# Patient Record
Sex: Male | Born: 1990 | Race: Black or African American | Hispanic: No | Marital: Married | Smoking: Never smoker
Health system: Southern US, Community
[De-identification: ages and names within clinical notes are randomized; demographics above are authoritative.]

## PROBLEM LIST (undated history)

## (undated) DIAGNOSIS — J45909 Unspecified asthma, uncomplicated: Secondary | ICD-10-CM

## (undated) DIAGNOSIS — T7840XA Allergy, unspecified, initial encounter: Secondary | ICD-10-CM

## (undated) HISTORY — DX: Unspecified asthma, uncomplicated: J45.909

## (undated) HISTORY — PX: NO PAST SURGERIES: SHX2092

## (undated) HISTORY — DX: Allergy, unspecified, initial encounter: T78.40XA

---
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
## 2016-02-29 ENCOUNTER — Ambulatory Visit (INDEPENDENT_AMBULATORY_CARE_PROVIDER_SITE_OTHER): Payer: BLUE CROSS/BLUE SHIELD | Admitting: Family Medicine

## 2016-02-29 ENCOUNTER — Encounter: Payer: Self-pay | Admitting: Family Medicine

## 2016-02-29 VITALS — BP 95/57 | HR 81 | Temp 97.8°F | Resp 12 | Ht 70.25 in | Wt 183.2 lb

## 2016-02-29 DIAGNOSIS — Z0000 Encounter for general adult medical examination without abnormal findings: Secondary | ICD-10-CM | POA: Insufficient documentation

## 2016-02-29 DIAGNOSIS — Z23 Encounter for immunization: Secondary | ICD-10-CM | POA: Diagnosis not present

## 2016-02-29 HISTORY — DX: Encounter for general adult medical examination without abnormal findings: Z00.00

## 2016-02-29 NOTE — Progress Notes (Signed)
Pre visit review using our clinic review tool, if applicable. No additional management support is needed unless otherwise documented below in the visit note. 

## 2016-02-29 NOTE — Progress Notes (Signed)
Subjective:  Patient ID: Darren Moore, male    DOB: 1991/02/25  Age: 26 y.o. MRN: [id number]  CC: Establish care/physical  HPI Darren ChurnGarland B Nakagawa is a 26 y.o. male presents to the clinic today to establish care.   Preventative Healthcare  Immunizations  Tetanus - ~ 2009.  Pneumococcal - N/A.  Flu - Flu vaccine today.  Labs: Declines labs at this time.  Exercise: No regular exercise.   Alcohol use: Yes. See below.  Smoking/tobacco use: No.  Regular dental exams: Yes.   Wears seat belt: Yes.   PMH, Surgical Hx, Family Hx, Social History reviewed and updated as below.  Past Medical History:  Diagnosis Date  . Asthma    Past Surgical History:  Procedure Laterality Date  . NO PAST SURGERIES     Family History  Problem Relation Age of Onset  . Hyperlipidemia Mother   . Hypertension Mother   . Diabetes Mother   . Hyperlipidemia Father   . Hypertension Father   . Diabetes Father   . Lung cancer Paternal Uncle   . Stroke Maternal Grandmother   . Prostate cancer Maternal Grandfather   . Prostate cancer Paternal Grandfather   . Breast cancer Cousin    Social History  Substance Use Topics  . Smoking status: Never Smoker  . Smokeless tobacco: Never Used  . Alcohol use 0.6 - 1.2 oz/week    1 - 2 Standard drinks or equivalent per week   Review of Systems General: Denies unexplained weight loss, fever. Skin: Denies new or changing mole, sore/wound that won't heal. ENT: Trouble hearing, ringing in the ears, sores in the mouth, hoarseness, trouble swallowing. Eyes: Denies trouble seeing/visual disturbance. Heart/CV: Denies chest pain, shortness of breath, edema, palpitations. Lungs/Resp: Denies cough, shortness of breath, hemoptysis. Abd/GI: Denies nausea, vomiting, diarrhea, constipation, abdominal pain, hematochezia, melena. GU: Denies dysuria, incontinence, hematuria, urinary frequency, difficulty starting/keeping stream, penile discharge, sexual  difficulty, lump in testicles. MSK: Denies joint pain/swelling, myalgias. Neuro: Denies headaches, weakness, numbness, dizziness, syncope. Psych: Denies sadness, anxiety, stress, memory difficulty. Endocrine: Denies polyuria and polydipsia.  Objective:   Today's Vitals: BP (!) 95/57   Pulse 81   Temp 97.8 F (36.6 C) (Oral)   Resp 12   Ht 5' 10.25" (1.784 m) Comment: with shoes  Wt 183 lb 4 oz (83.1 kg)   SpO2 100%   BMI 26.11 kg/m   Physical Exam  Constitutional: He is oriented to person, place, and time. He appears well-developed and well-nourished. No distress.  HENT:  Head: Normocephalic and atraumatic.  Nose: Nose normal.  Mouth/Throat: Oropharynx is clear and moist. No oropharyngeal exudate.  Normal TM's bilaterally.   Eyes: Conjunctivae are normal. No scleral icterus.  Neck: Neck supple.  Cardiovascular: Normal rate and regular rhythm.   No murmur heard. Pulmonary/Chest: Effort normal and breath sounds normal. He has no wheezes. He has no rales.  Abdominal: Soft. He exhibits no distension. There is no tenderness. There is no rebound and no guarding.  Musculoskeletal: Normal range of motion. He exhibits no edema.  Lymphadenopathy:    He has no cervical adenopathy.  Neurological: He is alert and oriented to person, place, and time.  Skin: Skin is warm and dry. No rash noted.  Psychiatric: He has a normal mood and affect.  Vitals reviewed.  Assessment & Plan:   Problem List Items Addressed This Visit    Annual physical exam    Flu vaccine today. Tdap up to date. Declines screening labs  at this time. No medical problems/concerns. Follow up annually.       Other Visit Diagnoses    Encounter for immunization       Relevant Orders   Flu Vaccine QUAD 36+ mos IM (Completed)     Follow-up: Annually  Everlene Other DO [facility]

## 2016-02-29 NOTE — Assessment & Plan Note (Addendum)
Flu vaccine today. Tdap up to date. Declines screening labs at this time. No medical problems/concerns. Follow up annually.

## 2016-02-29 NOTE — Patient Instructions (Signed)
Follow up annually.  Take care  Dr. Keian Odriscoll   Health Maintenance, Male A healthy lifestyle and preventative care can promote health and wellness.  Maintain regular health, dental, and eye exams.  Eat a healthy diet. Foods like vegetables, fruits, whole grains, low-fat dairy products, and lean protein foods contain the nutrients you need and are low in calories. Decrease your intake of foods high in solid fats, added sugars, and salt. Get information about a proper diet from your health care provider, if necessary.  Regular physical exercise is one of the most important things you can do for your health. Most adults should get at least 150 minutes of moderate-intensity exercise (any activity that increases your heart rate and causes you to sweat) each week. In addition, most adults need muscle-strengthening exercises on 2 or more days a week.   Maintain a healthy weight. The body mass index (BMI) is a screening tool to identify possible weight problems. It provides an estimate of body fat based on height and weight. Your health care provider can find your BMI and can help you achieve or maintain a healthy weight. For males 20 years and older:  A BMI below 18.5 is considered underweight.  A BMI of 18.5 to 24.9 is normal.  A BMI of 25 to 29.9 is considered overweight.  A BMI of 30 and above is considered obese.  Maintain normal blood lipids and cholesterol by exercising and minimizing your intake of saturated fat. Eat a balanced diet with plenty of fruits and vegetables. Blood tests for lipids and cholesterol should begin at age 20 and be repeated every 5 years. If your lipid or cholesterol levels are high, you are over age 50, or you are at high risk for heart disease, you may need your cholesterol levels checked more frequently.Ongoing high lipid and cholesterol levels should be treated with medicines if diet and exercise are not working.  If you smoke, find out from your health care  provider how to quit. If you do not use tobacco, do not start.  Lung cancer screening is recommended for adults aged 55-80 years who are at high risk for developing lung cancer because of a history of smoking. A yearly low-dose CT scan of the lungs is recommended for people who have at least a 30-pack-year history of smoking and are current smokers or have quit within the past 15 years. A pack year of smoking is smoking an average of 1 pack of cigarettes a day for 1 year (for example, a 30-pack-year history of smoking could mean smoking 1 pack a day for 30 years or 2 packs a day for 15 years). Yearly screening should continue until the smoker has stopped smoking for at least 15 years. Yearly screening should be stopped for people who develop a health problem that would prevent them from having lung cancer treatment.  If you choose to drink alcohol, do not have more than 2 drinks per day. One drink is considered to be 12 oz (360 mL) of beer, 5 oz (150 mL) of wine, or 1.5 oz (45 mL) of liquor.  Avoid the use of street drugs. Do not share needles with anyone. Ask for help if you need support or instructions about stopping the use of drugs.  High blood pressure causes heart disease and increases the risk of stroke. High blood pressure is more likely to develop in:  People who have blood pressure in the end of the normal range (100-139/85-89 mm Hg).  People who   are overweight or obese.  People who are African American.  If you are 18-39 years of age, have your blood pressure checked every 3-5 years. If you are 40 years of age or older, have your blood pressure checked every year. You should have your blood pressure measured twice-once when you are at a hospital or clinic, and once when you are not at a hospital or clinic. Record the average of the two measurements. To check your blood pressure when you are not at a hospital or clinic, you can use:  An automated blood pressure machine at a pharmacy.  A  home blood pressure monitor.  If you are 45-79 years old, ask your health care provider if you should take aspirin to prevent heart disease.  Diabetes screening involves taking a blood sample to check your fasting blood sugar level. This should be done once every 3 years after age 45 if you are at a normal weight and without risk factors for diabetes. Testing should be considered at a younger age or be carried out more frequently if you are overweight and have at least 1 risk factor for diabetes.  Colorectal cancer can be detected and often prevented. Most routine colorectal cancer screening begins at the age of 50 and continues through age 75. However, your health care provider may recommend screening at an earlier age if you have risk factors for colon cancer. On a yearly basis, your health care provider may provide home test kits to check for hidden blood in the stool. A small camera at the end of a tube may be used to directly examine the colon (sigmoidoscopy or colonoscopy) to detect the earliest forms of colorectal cancer. Talk to your health care provider about this at age 50 when routine screening begins. A direct exam of the colon should be repeated every 5-10 years through age 75, unless early forms of precancerous polyps or small growths are found.  People who are at an increased risk for hepatitis B should be screened for this virus. You are considered at high risk for hepatitis B if:  You were born in a country where hepatitis B occurs often. Talk with your health care provider about which countries are considered high risk.  Your parents were born in a high-risk country and you have not received a shot to protect against hepatitis B (hepatitis B vaccine).  You have HIV or AIDS.  You use needles to inject street drugs.  You live with, or have sex with, someone who has hepatitis B.  You are a man who has sex with other men (MSM).  You get hemodialysis treatment.  You take certain  medicines for conditions like cancer, organ transplantation, and autoimmune conditions.  Hepatitis C blood testing is recommended for all people born from 1945 through 1965 and any individual with known risk factors for hepatitis C.  Healthy men should no longer receive prostate-specific antigen (PSA) blood tests as part of routine cancer screening. Talk to your health care provider about prostate cancer screening.  Testicular cancer screening is not recommended for adolescents or adult males who have no symptoms. Screening includes self-exam, a health care provider exam, and other screening tests. Consult with your health care provider about any symptoms you have or any concerns you have about testicular cancer.  Practice safe sex. Use condoms and avoid high-risk sexual practices to reduce the spread of sexually transmitted infections (STIs).  You should be screened for STIs, including gonorrhea and chlamydia if:  You   are sexually active and are younger than 24 years.  You are older than 24 years, and your health care provider tells you that you are at risk for this type of infection.  Your sexual activity has changed since you were last screened, and you are at an increased risk for chlamydia or gonorrhea. Ask your health care provider if you are at risk.  If you are at risk of being infected with HIV, it is recommended that you take a prescription medicine daily to prevent HIV infection. This is called pre-exposure prophylaxis (PrEP). You are considered at risk if:  You are a man who has sex with other men (MSM).  You are a heterosexual man who is sexually active with multiple partners.  You take drugs by injection.  You are sexually active with a partner who has HIV.  Talk with your health care provider about whether you are at high risk of being infected with HIV. If you choose to begin PrEP, you should first be tested for HIV. You should then be tested every 3 months for as long as  you are taking PrEP.  Use sunscreen. Apply sunscreen liberally and repeatedly throughout the day. You should seek shade when your shadow is shorter than you. Protect yourself by wearing long sleeves, pants, a wide-brimmed hat, and sunglasses year round whenever you are outdoors.  Tell your health care provider of new moles or changes in moles, especially if there is a change in shape or color. Also, tell your health care provider if a mole is larger than the size of a pencil eraser.  A one-time screening for abdominal aortic aneurysm (AAA) and surgical repair of large AAAs by ultrasound is recommended for men aged 65-75 years who are current or former smokers.  Stay current with your vaccines (immunizations). This information is not intended to replace advice given to you by your health care provider. Make sure you discuss any questions you have with your health care provider. Document Released: 08/11/2007 Document Revised: 03/05/2014 Document Reviewed: 11/16/2014 Elsevier Interactive Patient Education  2017 [employer]  

## 2016-10-03 ENCOUNTER — Encounter: Payer: Self-pay | Admitting: Family Medicine

## 2017-03-01 ENCOUNTER — Encounter: Payer: BLUE CROSS/BLUE SHIELD | Admitting: Family Medicine

## 2021-05-12 ENCOUNTER — Encounter: Payer: Self-pay | Admitting: Family

## 2021-05-12 ENCOUNTER — Ambulatory Visit (INDEPENDENT_AMBULATORY_CARE_PROVIDER_SITE_OTHER): Payer: No Typology Code available for payment source | Admitting: Family

## 2021-05-12 VITALS — BP 134/85 | HR 80 | Temp 98.7°F | Ht 69.0 in | Wt 220.4 lb

## 2021-05-12 DIAGNOSIS — Z0000 Encounter for general adult medical examination without abnormal findings: Secondary | ICD-10-CM | POA: Diagnosis not present

## 2021-05-12 NOTE — Progress Notes (Signed)
?Phone: [phone] ?  ?Subjective:  ?Patient 31 y.o. male presenting for annual physical. ? ?Chief Complaint  ?Patient presents with  ? Establish Care  ? Annual Exam  ?  Not fasting W/ Labs  ? ? ?See problem oriented charting- ?ROS- full  review of systems was completed and negative. ? ?The following were reviewed and entered/updated in epic: ?Past Medical History:  ?Diagnosis Date  ? Allergy   ? Asthma   ? ?Patient Active Problem List  ? Diagnosis Date Noted  ? Annual physical exam 02/29/2016  ? ?Past Surgical History:  ?Procedure Laterality Date  ? NO PAST SURGERIES    ? ? ?Family History  ?Problem Relation Age of Onset  ? Hyperlipidemia Mother   ? Hypertension Mother   ? Diabetes Mother   ? Hyperlipidemia Father   ? Hypertension Father   ? Diabetes Father   ? Hyperlipidemia Sister   ? Lung cancer Paternal Uncle   ? Stroke Maternal Grandmother   ? Prostate cancer Maternal Grandfather   ? Prostate cancer Paternal Grandfather   ? Breast cancer Cousin   ? ? ?Medications- reviewed and updated ?No current outpatient medications on file.  ? ?No current facility-administered medications for this visit.  ? ? ?Allergies-reviewed and updated ?No Known Allergies ? ?Social History  ? ?Social History Narrative  ? Not on file  ? ?Objective  ?Objective:  ?BP 134/85 (BP Location: Left Arm, Patient Position: Sitting, Cuff Size: Large)   Pulse 80   Temp 98.7 ?F (37.1 ?C) (Temporal)   Ht 5\' 9"  (1.753 m)   Wt 220 lb 6.4 oz (100 kg)   SpO2 99%   BMI 32.55 kg/m?  ?Physical Exam ?Vitals and nursing note reviewed.  ?Constitutional:   ?   General: He is not in acute distress. ?   Appearance: Normal appearance.  ?HENT:  ?   Head: Normocephalic.  ?   Right Ear: Tympanic membrane and external ear normal.  ?   Left Ear: Tympanic membrane and external ear normal.  ?   Nose: Nose normal.  ?   Mouth/Throat:  ?   Mouth: Mucous membranes are moist.  ?Eyes:  ?   Extraocular Movements: Extraocular movements intact.  ?Cardiovascular:  ?    Rate and Rhythm: Normal rate and regular rhythm.  ?Pulmonary:  ?   Effort: Pulmonary effort is normal.  ?   Breath sounds: Normal breath sounds.  ?Abdominal:  ?   General: Abdomen is flat. There is no distension.  ?   Palpations: Abdomen is soft.  ?   Tenderness: There is no abdominal tenderness.  ?Musculoskeletal:     ?   General: Normal range of motion.  ?   Cervical back: Normal range of motion.  ?Skin: ?   General: Skin is warm and dry.  ?Neurological:  ?   Mental Status: He is alert and oriented to person, place, and time.  ?Psychiatric:     ?   Mood and Affect: Mood normal.     ?   Behavior: Behavior normal.     ?   Judgment: Judgment normal.  ? ?  ?Assessment and Plan  ? ?Health Maintenance counseling: ?1. Anticipatory guidance: Patient counseled regarding regular dental exams q6 months, eye exams yearly, avoiding smoking and second hand smoke, limiting alcohol to 2 beverages per day.   ?2. Risk factor reduction:  Advised patient of need for regular exercise and diet rich in fruits and vegetables to reduce risk of heart attack and  stroke.    ?Wt Readings from Last 3 Encounters:  ?05/12/21 220 lb 6.4 oz (100 kg)  ?02/29/16 183 lb 4 oz (83.1 kg)  ? ?3. Immunizations/screenings/ancillary studies ?Immunization History  ?Administered Date(s) Administered  ? Influenza,inj,Quad PF,6+ Mos 02/29/2016  ? Influenza-Unspecified 01/20/2021  ? PPD Test 03/16/2019  ? ?Health Maintenance Due  ?Topic Date Due  ? COVID-19 Vaccine (1) Never done  ? HIV Screening  Never done  ? Hepatitis C Screening  Never done  ? TETANUS/TDAP  02/26/2017  ?  ?4. Skin cancer screening- advised regular sunscreen use. Denies worrisome, changing, or new skin lesions.  ?5. Smoking associated screening:  smokes rarely, socially ?6. STD screening - 1-2/month ? ?Problem List Items Addressed This Visit   ? ?  ? Other  ? Annual physical exam - Primary  ? Relevant Orders  ? Comprehensive metabolic panel  ? TSH  ? Lipid panel  ? CBC with  Differential/Platelet  ? HIV antibody (with reflex)  ? ? ?Recommended follow up: Return for any future concerns . ?No future appointments. ? ? ?Lab/Order associations:non- fasting ? ? ?Jeanie Sewer, NP ? ? ?

## 2021-05-12 NOTE — Patient Instructions (Signed)
Welcome to [facility]! It was a pleasure meeting you today. ? ?Go to the lab for blood work today. ? ?Have a great weekend! ? ? ? ?PLEASE NOTE: ? ?If you had any LAB tests please let us know if you have not heard back within a few days. You may see your results on MyChart before we have a chance to review them but we will give you a call once they are reviewed by [place]. If we ordered any REFERRALS today, please let us know if you have not heard from their office within the next week.  ?Let us know through MyChart if you are needing REFILLS, or have your pharmacy send [place] the request. You can also use MyChart to communicate with me or any office staff. ? ?Please try these tips to maintain a healthy lifestyle: ? ?Eat most of your calories during the day when you are active. Eliminate processed foods including packaged sweets (pies, cakes, cookies), reduce intake of potatoes, white bread, white pasta, and white rice. Look for whole grain options, oat flour or almond flour. ? ?Each meal should contain half fruits/vegetables, one quarter protein, and one quarter carbs (no bigger than a computer mouse). ? ?Cut down on sweet beverages. This includes juice, soda, and sweet tea. Also watch fruit intake, though this is a healthier sweet option, it still contains natural sugar! Limit to 3 servings daily. ? ?Drink at least 1 glass of water with each meal and aim for at least 8 glasses per day ? ?Exercise at least 150 minutes every week.  ? ?

## 2021-05-13 LAB — COMPREHENSIVE METABOLIC PANEL
AG Ratio: 1.7 (calc) (ref 1.0–2.5)
ALT: 26 U/L (ref 9–46)
AST: 31 U/L (ref 10–40)
Albumin: 4.9 g/dL (ref 3.6–5.1)
Alkaline phosphatase (APISO): 67 U/L (ref 36–130)
BUN: 12 mg/dL (ref 7–25)
CO2: 21 mmol/L (ref 20–32)
Calcium: 10.4 mg/dL — ABNORMAL HIGH (ref 8.6–10.3)
Chloride: 106 mmol/L (ref 98–110)
Creat: 1.15 mg/dL (ref 0.60–1.26)
Globulin: 2.9 g/dL (calc) (ref 1.9–3.7)
Glucose, Bld: 100 mg/dL — ABNORMAL HIGH (ref 65–99)
Potassium: 4.7 mmol/L (ref 3.5–5.3)
Sodium: 146 mmol/L (ref 135–146)
Total Bilirubin: 0.5 mg/dL (ref 0.2–1.2)
Total Protein: 7.8 g/dL (ref 6.1–8.1)

## 2021-05-13 LAB — LIPID PANEL
Cholesterol: 175 mg/dL (ref <200)
HDL: 39 mg/dL — ABNORMAL LOW (ref >=40)
LDL Cholesterol (Calc): 120 mg/dL (calc) — ABNORMAL HIGH
Non-HDL Cholesterol (Calc): 136 mg/dL (calc) — ABNORMAL HIGH (ref <130)
Total CHOL/HDL Ratio: 4.5 (calc) (ref <5.0)
Triglycerides: 65 mg/dL (ref <150)

## 2021-05-13 LAB — CBC WITH DIFFERENTIAL/PLATELET
Absolute Monocytes: 684 cells/uL (ref 200–950)
Basophils Absolute: 23 cells/uL (ref 0–200)
Basophils Relative: 0.3 %
Eosinophils Absolute: 53 cells/uL (ref 15–500)
Eosinophils Relative: 0.7 %
HCT: 47.2 % (ref 38.5–50.0)
Hemoglobin: 15.5 g/dL (ref 13.2–17.1)
Lymphs Abs: 2204 cells/uL (ref 850–3900)
MCH: 27.2 pg (ref 27.0–33.0)
MCHC: 32.8 g/dL (ref 32.0–36.0)
MCV: 82.8 fL (ref 80.0–100.0)
MPV: 11.1 fL (ref 7.5–12.5)
Monocytes Relative: 9 %
Neutro Abs: 4636 cells/uL (ref 1500–7800)
Neutrophils Relative %: 61 %
Platelets: 265 10*3/uL (ref 140–400)
RBC: 5.7 10*6/uL (ref 4.20–5.80)
RDW: 12.8 % (ref 11.0–15.0)
Total Lymphocyte: 29 %
WBC: 7.6 10*3/uL (ref 3.8–10.8)

## 2021-05-13 LAB — TSH: TSH: 0.66 mIU/L (ref 0.40–4.50)

## 2021-05-15 LAB — HIV ANTIBODY (ROUTINE TESTING W REFLEX): HIV 1&2 Ab, 4th Generation: NONREACTIVE

## 2021-05-17 NOTE — Progress Notes (Signed)
Glucose, electrolytes, blood count, thyroid, liver & kidney function all normal. ? ?Bad cholesterol # -LDL - is high. ?Need to reduce any fried foods, alcohol, nonnutritional snacks e.g. chips/cookies,pies, cakes and candies, fatty meat (red meat), high fat dairy foods:  including cheese, milk, ice cream.  ?Increase fruits/vegetables/fiber.   ?Continue or restart an exercise routine, shooting for 5-7days per week.  ? ?Recommend repeating fasting lipids (no food or drink except black coffee or water after midnight) in 6 months with an office visit, can schedule today. ? ?

## 2022-02-08 ENCOUNTER — Encounter: Payer: Self-pay | Admitting: *Deleted

## 2022-05-16 ENCOUNTER — Encounter: Payer: Self-pay | Admitting: Family

## 2022-05-16 ENCOUNTER — Ambulatory Visit (INDEPENDENT_AMBULATORY_CARE_PROVIDER_SITE_OTHER): Payer: BC Managed Care – PPO | Admitting: Family

## 2022-05-16 VITALS — BP 124/76 | HR 78 | Temp 97.7°F | Ht 69.0 in | Wt 224.0 lb

## 2022-05-16 DIAGNOSIS — R7989 Other specified abnormal findings of blood chemistry: Secondary | ICD-10-CM | POA: Diagnosis not present

## 2022-05-16 DIAGNOSIS — Z0000 Encounter for general adult medical examination without abnormal findings: Secondary | ICD-10-CM

## 2022-05-16 LAB — LIPID PANEL
Cholesterol: 180 mg/dL (ref 0–200)
HDL: 30.5 mg/dL — ABNORMAL LOW (ref >39.00)
LDL Cholesterol: 139 mg/dL — ABNORMAL HIGH (ref 0–99)
NonHDL: 149.38
Total CHOL/HDL Ratio: 6
Triglycerides: 52 mg/dL (ref 0.0–149.0)
VLDL: 10.4 mg/dL (ref 0.0–40.0)

## 2022-05-16 LAB — COMPREHENSIVE METABOLIC PANEL
ALT: 31 U/L (ref 0–53)
AST: 60 U/L — ABNORMAL HIGH (ref 0–37)
Albumin: 4.3 g/dL (ref 3.5–5.2)
Alkaline Phosphatase: 61 U/L (ref 39–117)
BUN: 11 mg/dL (ref 6–23)
CO2: 28 mEq/L (ref 19–32)
Calcium: 9.4 mg/dL (ref 8.4–10.5)
Chloride: 102 mEq/L (ref 96–112)
Creatinine, Ser: 1.09 mg/dL (ref 0.40–1.50)
GFR: 90.23 mL/min (ref >60.00)
Glucose, Bld: 79 mg/dL (ref 70–99)
Potassium: 4.3 mEq/L (ref 3.5–5.1)
Sodium: 137 mEq/L (ref 135–145)
Total Bilirubin: 0.6 mg/dL (ref 0.2–1.2)
Total Protein: 6.8 g/dL (ref 6.0–8.3)

## 2022-05-16 LAB — CBC WITH DIFFERENTIAL/PLATELET
Basophils Absolute: 0 10*3/uL (ref 0.0–0.1)
Basophils Relative: 0.8 % (ref 0.0–3.0)
Eosinophils Absolute: 0.1 10*3/uL (ref 0.0–0.7)
Eosinophils Relative: 1.3 % (ref 0.0–5.0)
HCT: 46.8 % (ref 39.0–52.0)
Hemoglobin: 15.3 g/dL (ref 13.0–17.0)
Lymphocytes Relative: 39.3 % (ref 12.0–46.0)
Lymphs Abs: 1.6 10*3/uL (ref 0.7–4.0)
MCHC: 32.7 g/dL (ref 30.0–36.0)
MCV: 82.8 fl (ref 78.0–100.0)
Monocytes Absolute: 0.5 10*3/uL (ref 0.1–1.0)
Monocytes Relative: 12.3 % — ABNORMAL HIGH (ref 3.0–12.0)
Neutro Abs: 1.9 10*3/uL (ref 1.4–7.7)
Neutrophils Relative %: 46.3 % (ref 43.0–77.0)
Platelets: 238 10*3/uL (ref 150.0–400.0)
RBC: 5.65 Mil/uL (ref 4.22–5.81)
RDW: 13.2 % (ref 11.5–15.5)
WBC: 4 10*3/uL (ref 4.0–10.5)

## 2022-05-16 NOTE — Patient Instructions (Signed)
It was very nice to see you today!   I will review your lab results via MyChart in a few days.  Congrats on your wife's pregnancy! Happy Spring!    PLEASE NOTE:  If you had any lab tests please let us know if you have not heard back within a few days. You may see your results on MyChart before we have a chance to review them but we will give you a call once they are reviewed by [place]. If we ordered any referrals today, please let us know if you have not heard from their office within the next week.

## 2022-05-16 NOTE — Progress Notes (Signed)
Phone: [phone]  Subjective:  Patient 32 y.o. male presenting for annual physical.  Chief Complaint  Patient presents with   Annual Exam    Fasting w/ labs   See problem oriented charting- ROS- full  review of systems was completed and negative.   The following were reviewed and entered/updated in epic: Past Medical History:  Diagnosis Date   Allergy    Annual physical exam 02/29/2016   Asthma    There are no problems to display for this patient.  Past Surgical History:  Procedure Laterality Date   NO PAST SURGERIES     Family History  Problem Relation Age of Onset   Hyperlipidemia Mother    Hypertension Mother    Diabetes Mother    Hyperlipidemia Father    Hypertension Father    Diabetes Father    Hyperlipidemia Sister    Lung cancer Paternal Uncle    Stroke Maternal Grandmother    Prostate cancer Maternal Grandfather    Prostate cancer Paternal Grandfather    Breast cancer Cousin     Medications- reviewed and updated No current outpatient medications on file.   No current facility-administered medications for this visit.   Allergies-reviewed and updated No Known Allergies  Social History   Social History Narrative   Not on file   Objective:  BP 124/76 (BP Location: Left Arm, Patient Position: Sitting, Cuff Size: Large)   Pulse 78   Temp 97.7 F (36.5 C) (Temporal)   Ht 5\' 9"  (1.753 m)   Wt 224 lb (101.6 kg)   SpO2 96%   BMI 33.08 kg/m  Physical Exam Vitals and nursing note reviewed.  Constitutional:      General: He is not in acute distress.    Appearance: Normal appearance. He is obese.  HENT:     Head: Normocephalic.     Right Ear: Tympanic membrane and external ear normal.     Left Ear: Tympanic membrane and external ear normal.     Nose: Nose normal.     Mouth/Throat:     Mouth: Mucous membranes are moist.  Eyes:     Extraocular Movements: Extraocular movements intact.  Cardiovascular:     Rate and Rhythm: Normal rate and  regular rhythm.  Pulmonary:     Effort: Pulmonary effort is normal.     Breath sounds: Normal breath sounds.  Abdominal:     General: Abdomen is flat. There is no distension.     Palpations: Abdomen is soft.     Tenderness: There is no abdominal tenderness.  Musculoskeletal:        General: Normal range of motion.     Cervical back: Normal range of motion.  Skin:    General: Skin is warm and dry.  Neurological:     Mental Status: He is alert and oriented to person, place, and time.  Psychiatric:        Mood and Affect: Mood normal.        Behavior: Behavior normal.        Judgment: Judgment normal.     Assessment and Plan   Health Maintenance counseling: 1. Anticipatory guidance: Patient counseled regarding regular dental exams q6 months, eye exams yearly, avoiding smoking and second hand smoke, limiting alcohol to 2 beverages per day.   2. Risk factor reduction:  Advised patient of need for regular exercise and diet rich in fruits and vegetables to reduce risk of heart attack and stroke.    Wt Readings from Last 3 Encounters:  05/16/22 224 lb (101.6 kg)  05/12/21 220 lb 6.4 oz (100 kg)  02/29/16 183 lb 4 oz (83.1 kg)   3. Immunizations/screenings/ancillary studies Immunization History  Administered Date(s) Administered   DTaP 09/16/1990, 11/20/1990, 01/19/1991, 07/09/1995, 07/17/1997   Hepatitis B 12/02/2002, 01/12/2003, 06/01/2003   Influenza,inj,Quad PF,6+ Mos 02/29/2016   Influenza-Unspecified 01/20/2021   MMR 11/16/1991, 07/09/1995   PPD Test 03/16/2019   Td 10/28/2012   Tdap 10/28/2012   Health Maintenance Due  Topic Date Due   COVID-19 Vaccine (1) Never done   Hepatitis C Screening  Never done   INFLUENZA VACCINE  09/26/2021    4. Skin cancer screening- advised regular sunscreen use. Denies worrisome, changing, or new skin lesions.  5. Smoking associated screening: non- smoker 7. Alcohol screening: none now, wife pregnant  Annual physical exam -      Comprehensive metabolic panel -     CBC with Differential/Platelet  High serum low-density lipoprotein (LDL) -     Lipid panel  Recommended follow up: No follow-ups on file. No future appointments.  Lab/Order associations:  fasting  Jeanie Sewer, NP

## 2022-05-17 NOTE — Progress Notes (Signed)
Your glucose, electrolytes, blood count, thyroid, liver & kidney function are all normal. You one liver enzyme is a little elevated. This is sometimes is transient and not worrisome. Do you take any over the counter herbal supplements?  Your bad cholesterol (LDL) is high: Need to reduce any fried foods, alcohol, nonnutritional snacks e.g. chips/cookies,pies, cakes and candies, fatty meat (red meat), high fat dairy foods:  including cheese, milk, ice cream.  Also your good cholesterol (HDL) is too low. This can go higher with eating more fish, (or taking fish oil daily) and other omega 3 rich foods. Also exercise will raise your HDL! Increase fruits/vegetables/fiber.   Continue or restart an exercise routine, shooting for 52min 5-7days per week.

## 2022-05-30 ENCOUNTER — Encounter: Payer: Self-pay | Admitting: Family

## 2022-05-30 DIAGNOSIS — R0683 Snoring: Secondary | ICD-10-CM

## 2022-05-30 NOTE — Telephone Encounter (Signed)
yes, can use snoring or apneic episodes dx code, & let Jiarui know,  thx.

## 2022-06-06 ENCOUNTER — Other Ambulatory Visit: Payer: Self-pay

## 2022-06-06 DIAGNOSIS — R0683 Snoring: Secondary | ICD-10-CM

## 2022-06-20 ENCOUNTER — Telehealth: Payer: Self-pay | Admitting: Family

## 2022-06-20 NOTE — Telephone Encounter (Signed)
Caller states that patient's insurance will NOT cover in office sleep study but they WILL cover an at home sleep study. If in agreement, will need to place new order in EPIC. For additional questions, Camelia Eng can be reached @ [phone].

## 2022-06-21 ENCOUNTER — Other Ambulatory Visit: Payer: Self-pay

## 2022-06-21 DIAGNOSIS — R0683 Snoring: Secondary | ICD-10-CM

## 2022-06-21 NOTE — Telephone Encounter (Signed)
I have ordered an at home sleep study thru [facility]

## 2022-09-18 ENCOUNTER — Other Ambulatory Visit: Payer: Self-pay

## 2022-09-18 DIAGNOSIS — R0683 Snoring: Secondary | ICD-10-CM

## 2022-10-24 ENCOUNTER — Encounter: Payer: Self-pay | Admitting: Family

## 2022-10-24 DIAGNOSIS — R0683 Snoring: Secondary | ICD-10-CM

## 2022-10-26 NOTE — Telephone Encounter (Signed)
We need to call their office and ask how to order the home study since we are not doing correctly, thx.

## 2022-10-30 NOTE — Telephone Encounter (Signed)
Contacted [facility] at [facility], correct order pending.   Please review and sign

## 2022-10-30 NOTE — Telephone Encounter (Signed)
Thx

## 2022-11-30 NOTE — Telephone Encounter (Signed)
call the phone # for [facility] and ask them if they have the order for the Home sleep study (I was told they did) Has the equipment been mailed out? Or what do we need to do? Can they accept your verbal order?? Let me know!

## 2022-11-30 NOTE — Telephone Encounter (Signed)
I called and LVM for [facility].

## 2023-01-03 ENCOUNTER — Other Ambulatory Visit: Payer: Self-pay | Admitting: Family

## 2023-01-03 DIAGNOSIS — R0683 Snoring: Secondary | ICD-10-CM

## 2023-01-16 ENCOUNTER — Ambulatory Visit (HOSPITAL_BASED_OUTPATIENT_CLINIC_OR_DEPARTMENT_OTHER): Payer: BC Managed Care – PPO | Attending: Family | Admitting: Internal Medicine

## 2023-01-16 DIAGNOSIS — R0683 Snoring: Secondary | ICD-10-CM

## 2023-01-28 ENCOUNTER — Ambulatory Visit (HOSPITAL_BASED_OUTPATIENT_CLINIC_OR_DEPARTMENT_OTHER): Payer: BC Managed Care – PPO | Attending: Internal Medicine | Admitting: Internal Medicine

## 2023-01-28 VITALS — Ht 69.0 in | Wt 224.0 lb

## 2023-01-28 DIAGNOSIS — R0683 Snoring: Secondary | ICD-10-CM

## 2023-02-01 ENCOUNTER — Ambulatory Visit (HOSPITAL_BASED_OUTPATIENT_CLINIC_OR_DEPARTMENT_OTHER): Payer: BC Managed Care – PPO | Attending: Internal Medicine | Admitting: Internal Medicine

## 2023-02-01 DIAGNOSIS — R0683 Snoring: Secondary | ICD-10-CM | POA: Insufficient documentation

## 2023-02-09 DIAGNOSIS — R0683 Snoring: Secondary | ICD-10-CM | POA: Diagnosis not present

## 2023-02-09 NOTE — Procedures (Signed)
     Patient Name: Darren Moore, Darren Moore Date: 02/04/2023 Gender: Male D.O.B: May 05, 1990 Age (years): 32 Referring Provider: Dulce Sellar NP Height (inches): 69 Interpreting Physician: Jetty Duhamel MD, ABSM Weight (lbs): 224 RPSGT: [place] BMI: 33 MRN: [id number] Neck Size: <br>  CLINICAL INFORMATION Sleep Study Type: HST Indication for sleep study: OSA Epworth Sleepiness Score: 3  SLEEP STUDY TECHNIQUE A multi-channel overnight portable sleep study was performed. The channels recorded were: nasal airflow, thoracic respiratory movement, and oxygen saturation with a pulse oximetry. Snoring was also monitored.  MEDICATIONS Patient self administered medications include: none rep.  SLEEP ARCHITECTURE Patient was studied for 360.9 minutes. The sleep efficiency was 100.0 % and the patient was supine for 0%. The arousal index was 0.0 per hour.  RESPIRATORY PARAMETERS The overall AHI was 4.2 per hour, with a central apnea index of 0 per hour. The oxygen nadir was 87% during sleep.  CARDIAC DATA Mean heart rate during sleep was 77.8 bpm.  IMPRESSIONS - No significant obstructive sleep apnea occurred during this study (AHI = 4.2/h). - Mild oxygen desaturation was noted during this study (Min O2 = 87%, Mean 96%). - Patient snored 2.3% during the sleep.  DIAGNOSIS - Normal study  RECOMMENDATIONS - Be careful with alcohol, sedatives and other CNS depressants that may worsen sleep apnea and disrupt normal sleep architecture. - Sleep hygiene should be reviewed to assess factors that may improve sleep quality. - Weight management and regular exercise should be initiated or continued.  [Electronically signed] 02/09/2023 11:19 AM  Jetty Duhamel MD, ABSM Diplomate, American Board of Sleep Medicine NPI: [id number]                        Jetty Duhamel Diplomate, American Board of Sleep Medicine  ELECTRONICALLY SIGNED ON:  02/09/2023, 11:00  AM [facility] PH: [phone]   FX: [phone] ACCREDITED BY THE AMERICAN ACADEMY OF SLEEP MEDICINE

## 2023-03-25 ENCOUNTER — Ambulatory Visit: Payer: BC Managed Care – PPO | Admitting: Family

## 2023-03-25 NOTE — Progress Notes (Deleted)
   Patient ID: Darren Moore, male    DOB: 01-30-1991, 33 y.o.   MRN: [id number]  No chief complaint on file.     Assessment & Plan:  There are no diagnoses linked to this encounter.  Subjective:    No outpatient medications prior to visit.   No facility-administered medications prior to visit.   Past Medical History:  Diagnosis Date   Allergy    Annual physical exam 02/29/2016   Asthma    Past Surgical History:  Procedure Laterality Date   NO PAST SURGERIES     No Known Allergies    Objective:    Physical Exam Vitals and nursing note reviewed.  Constitutional:      General: He is not in acute distress.    Appearance: Normal appearance.  HENT:     Head: Normocephalic.  Cardiovascular:     Rate and Rhythm: Normal rate and regular rhythm.  Pulmonary:     Effort: Pulmonary effort is normal.     Breath sounds: Normal breath sounds.  Musculoskeletal:        General: Normal range of motion.     Cervical back: Normal range of motion.  Skin:    General: Skin is warm and dry.  Neurological:     Mental Status: He is alert and oriented to person, place, and time.  Psychiatric:        Mood and Affect: Mood normal.    There were no vitals taken for this visit. Wt Readings from Last 3 Encounters:  01/28/23 224 lb (101.6 kg)  01/16/23 224 lb (101.6 kg)  05/16/22 224 lb (101.6 kg)       Dulce Sellar, NP

## 2023-05-01 NOTE — Progress Notes (Signed)
 non-visit doc

## 2023-05-20 ENCOUNTER — Encounter: Payer: Self-pay | Admitting: Family

## 2023-10-01 NOTE — Procedures (Unsigned)
Result scanned to media

## 2023-10-01 NOTE — Procedures (Signed)
Needs repeat
